# Patient Record
Sex: Female | Born: 1985 | Race: White | Hispanic: No | Marital: Single | Smoking: Never smoker
Health system: Southern US, Community
[De-identification: ages and names within clinical notes are randomized; demographics above are authoritative.]

## PROBLEM LIST (undated history)

## (undated) ENCOUNTER — Inpatient Hospital Stay (HOSPITAL_COMMUNITY): Payer: Self-pay

## (undated) DIAGNOSIS — Z975 Presence of (intrauterine) contraceptive device: Secondary | ICD-10-CM

## (undated) DIAGNOSIS — IMO0002 Reserved for concepts with insufficient information to code with codable children: Secondary | ICD-10-CM

## (undated) DIAGNOSIS — D649 Anemia, unspecified: Secondary | ICD-10-CM

## (undated) HISTORY — PX: GYNECOLOGIC CRYOSURGERY: SHX857

## (undated) HISTORY — PX: KNEE SURGERY: SHX244

---

## 2006-06-26 DIAGNOSIS — R58 Hemorrhage, not elsewhere classified: Secondary | ICD-10-CM

## 2011-03-28 ENCOUNTER — Inpatient Hospital Stay (INDEPENDENT_AMBULATORY_CARE_PROVIDER_SITE_OTHER)
Admission: RE | Admit: 2011-03-28 | Discharge: 2011-03-28 | Disposition: A | Discharge status: Home / Self Care | Payer: Self-pay | Source: Ambulatory Visit | Attending: Family Medicine | Admitting: Family Medicine

## 2011-03-28 DIAGNOSIS — W57XXXA Bitten or stung by nonvenomous insect and other nonvenomous arthropods, initial encounter: Secondary | ICD-10-CM

## 2011-03-28 DIAGNOSIS — R112 Nausea with vomiting, unspecified: Secondary | ICD-10-CM

## 2011-03-28 DIAGNOSIS — N390 Urinary tract infection, site not specified: Secondary | ICD-10-CM

## 2011-03-28 LAB — POCT I-STAT, CHEM 8
BUN: 7 mg/dL (ref 6–23)
Chloride: 105 mEq/L (ref 96–112)
Sodium: 141 mEq/L (ref 135–145)
TCO2: 22 mmol/L (ref 0–100)

## 2011-03-28 LAB — POCT URINALYSIS DIP (DEVICE)
Bilirubin Urine: NEGATIVE
Ketones, ur: NEGATIVE mg/dL
Protein, ur: NEGATIVE mg/dL

## 2011-03-28 LAB — POCT PREGNANCY, URINE: Preg Test, Ur: NEGATIVE

## 2011-03-30 LAB — URINE CULTURE: Colony Count: >=100000

## 2011-07-31 ENCOUNTER — Inpatient Hospital Stay (HOSPITAL_COMMUNITY)
Admission: AD | Admit: 2011-07-31 | Discharge: 2011-07-31 | Disposition: A | Discharge status: Home / Self Care | Payer: Self-pay | Source: Ambulatory Visit | Attending: Obstetrics & Gynecology | Admitting: Obstetrics & Gynecology

## 2011-07-31 ENCOUNTER — Encounter (HOSPITAL_COMMUNITY): Payer: Self-pay

## 2011-07-31 DIAGNOSIS — Z975 Presence of (intrauterine) contraceptive device: Secondary | ICD-10-CM

## 2011-07-31 DIAGNOSIS — N946 Dysmenorrhea, unspecified: Secondary | ICD-10-CM | POA: Insufficient documentation

## 2011-07-31 DIAGNOSIS — Z30432 Encounter for removal of intrauterine contraceptive device: Secondary | ICD-10-CM | POA: Insufficient documentation

## 2011-07-31 DIAGNOSIS — N921 Excessive and frequent menstruation with irregular cycle: Secondary | ICD-10-CM | POA: Insufficient documentation

## 2011-07-31 HISTORY — DX: Presence of (intrauterine) contraceptive device: Z97.5

## 2011-07-31 HISTORY — DX: Reserved for concepts with insufficient information to code with codable children: IMO0002

## 2011-07-31 HISTORY — DX: Anemia, unspecified: D64.9

## 2011-07-31 LAB — WET PREP, GENITAL
Trich, Wet Prep: NONE SEEN
Yeast Wet Prep HPF POC: NONE SEEN

## 2011-07-31 LAB — URINALYSIS, ROUTINE W REFLEX MICROSCOPIC
Bilirubin Urine: NEGATIVE
Glucose, UA: NEGATIVE mg/dL
Hgb urine dipstick: NEGATIVE
Ketones, ur: NEGATIVE mg/dL
Nitrite: NEGATIVE
Protein, ur: NEGATIVE mg/dL
Specific Gravity, Urine: 1.01 (ref 1.005–1.030)
Urobilinogen, UA: 0.2 mg/dL (ref 0.0–1.0)
pH: 6 (ref 5.0–8.0)

## 2011-07-31 LAB — URINE MICROSCOPIC-ADD ON

## 2011-07-31 LAB — POCT PREGNANCY, URINE: Preg Test, Ur: NEGATIVE

## 2011-07-31 NOTE — ED Provider Notes (Signed)
History    Pt presents to MAU with abdominal cramping and vaginal spotting x3 days and expresses worry that her IUD is "in the wrong place" or not working.  IUD expires in 2013 so desires removal at this time and will schedule replacement with provider at later date.  Chief Complaint  Patient presents with  . Abdominal Pain   HPI  OB History    Grav Para Term Preterm Abortions TAB SAB Ect Mult Living   3 2 2  1  1   2       Past Medical History  Diagnosis Date  . Intrauterine device   . Anemia   . Abnormal Pap smear     Past Surgical History  Procedure Date  . Knee surgery     Family History  Problem Relation Age of Onset  . Anesthesia problems Neg Hx     History  Substance Use Topics  . Smoking status: Never Smoker   . Smokeless tobacco: Never Used  . Alcohol Use: 0.6 oz/week    1 Shots of liquor per week    Allergies:  Allergies  Allergen Reactions  . Amoxicillin Anaphylaxis and Hives  . Penicillins Anaphylaxis and Hives    No prescriptions prior to admission    Review of Systems  Constitutional: Negative.   HENT: Negative.   Eyes: Negative.   Respiratory: Negative.   Cardiovascular: Negative.   Gastrointestinal: Negative.   Genitourinary: Negative.        Reports vaginal spotting and menstrual cramping x3 days.  Musculoskeletal: Negative.   Skin: Negative.   Neurological: Negative.   Endo/Heme/Allergies: Negative.   Psychiatric/Behavioral: Negative.    Physical Exam   Blood pressure 118/64, pulse 97, temperature 99 F (37.2 C), temperature source Oral, resp. rate 16, height 5' 8.25" (1.734 m), weight 100.517 kg (221 lb 9.6 oz), SpO2 98.00%.  Physical Exam  Constitutional: She is oriented to person, place, and time. She appears well-developed and well-nourished.  HENT:  Head: Normocephalic.  Neck: Normal range of motion.  Cardiovascular: Normal rate, regular rhythm and normal heart sounds.   Respiratory: Effort normal and breath sounds  normal.  GI: Soft.  Genitourinary: Vagina normal. Cervix exhibits no motion tenderness and no friability. No erythema or bleeding around the vagina. No vaginal discharge found.       Cervix soft, pink, nonfriable, with small amount clear-white discharge.  Mirena strings easily visualized at ~3 cm length from cervical os.  Musculoskeletal: Normal range of motion.  Neurological: She is alert and oriented to person, place, and time.  Skin: Skin is warm and dry.  Psychiatric: She has a normal mood and affect. Her behavior is normal. Judgment and thought content normal.    MAU Course  Procedures Speculum exam, GC Chlamydia, wet prep  With strings easily visualized, IUD appeared to be in place.  Pt still desired removal because of recent onset of cramping/bleeding.  Discussed immediate risks of pregnancy with removal of IUD.  Pt stated understanding and desires removal.. Mirena removed today without complication.  Discussed contraceptive choices with pt.  Assessment and Plan  A: Metrorrhagia with IUD Dysmenoria  P:  D/C home with plan for pt to follow up with OB/Gyn provider for contraception.  LEFTWICH-KIRBY, LISA 07/31/2011, 5:27 PM

## 2011-07-31 NOTE — Progress Notes (Signed)
Pt states IUD since 2008 via Dr. Lilian Kapur in [facility] . Notes pain with intercourse, and has had spotting x3-4 weeks. Pt states feels out of place.

## 2011-07-31 NOTE — Progress Notes (Signed)
Patient states she has had a Mirena since 2008. Has never had any bleeding or pain with it. For the past 3-4 weeks has been having increasing abdominal pain, pain with intercourse and spotting on and off. No bleeding at this time. States she is able to feel the IUD during intercourse and partner feels sticking sensation.

## 2011-08-05 NOTE — ED Provider Notes (Signed)
Agree with above note.  Oneika Simonian H. 08/05/2011 8:00 PM

## 2014-05-22 ENCOUNTER — Encounter (HOSPITAL_COMMUNITY): Payer: Self-pay

## 2015-02-05 ENCOUNTER — Inpatient Hospital Stay (HOSPITAL_COMMUNITY)
Admission: AD | Admit: 2015-02-05 | Discharge: 2015-02-06 | Disposition: A | Discharge status: Home / Self Care | Payer: Self-pay | Source: Ambulatory Visit | Attending: Obstetrics & Gynecology | Admitting: Obstetrics & Gynecology

## 2015-02-05 ENCOUNTER — Encounter (HOSPITAL_COMMUNITY): Payer: Self-pay | Admitting: Medical

## 2015-02-05 DIAGNOSIS — O219 Vomiting of pregnancy, unspecified: Secondary | ICD-10-CM

## 2015-02-05 DIAGNOSIS — O418X10 Other specified disorders of amniotic fluid and membranes, first trimester, not applicable or unspecified: Secondary | ICD-10-CM

## 2015-02-05 DIAGNOSIS — O468X1 Other antepartum hemorrhage, first trimester: Secondary | ICD-10-CM

## 2015-02-05 DIAGNOSIS — O209 Hemorrhage in early pregnancy, unspecified: Secondary | ICD-10-CM

## 2015-02-05 DIAGNOSIS — O208 Other hemorrhage in early pregnancy: Secondary | ICD-10-CM | POA: Insufficient documentation

## 2015-02-05 DIAGNOSIS — O210 Mild hyperemesis gravidarum: Secondary | ICD-10-CM | POA: Insufficient documentation

## 2015-02-05 DIAGNOSIS — Z3A01 Less than 8 weeks gestation of pregnancy: Secondary | ICD-10-CM | POA: Insufficient documentation

## 2015-02-05 NOTE — MAU Provider Note (Signed)
History     CSN: [id number]  Arrival date and time: 02/05/15 2231   First Provider Initiated Contact with Patient 02/05/15 2351      No chief complaint on file.  HPI Ms. Anna Luna is a 29 y.o. [phone] at 102w1d who presents to MAU today with complaint of spotting, cramping and N/V. The patient states recent +HPT. She states spotting and cramping has been intermittent for the last few days. She rates pain at 4/10 now. She denies UTI symptoms, diarrhea or fever. She states very light spotting only without heavy bleeding or clots. She states N/V for the last few days. She is unable to keep anything down. She has not taken anything for pain or nausea.   OB History    Gravida Para Term Preterm AB TAB SAB Ectopic Multiple Living   5 2 2  1  1   2       Past Medical History  Diagnosis Date  . Intrauterine device   . Anemia   . Abnormal Pap smear     Past Surgical History  Procedure Laterality Date  . Knee surgery      Family History  Problem Relation Age of Onset  . Anesthesia problems Neg Hx     History  Substance Use Topics  . Smoking status: Never Smoker   . Smokeless tobacco: Never Used  . Alcohol Use: No    Allergies:  Allergies  Allergen Reactions  . Amoxicillin Anaphylaxis and Hives  . Penicillins Anaphylaxis and Hives    No prescriptions prior to admission    Review of Systems  Constitutional: Negative for fever and malaise/fatigue.  Gastrointestinal: Positive for nausea, vomiting and abdominal pain. Negative for diarrhea and constipation.  Genitourinary: Negative for dysuria, urgency and frequency.       + vaginal bleeding Neg - vaginal discharge   Physical Exam   Blood pressure 123/78, pulse 96, temperature 98.3 F (36.8 C), temperature source Oral, resp. rate 16, height 5\' 8"  (1.727 m), weight 208 lb (94.348 kg), last menstrual period 12/31/2014, SpO2 100 %.  Physical Exam  Nursing note and vitals reviewed. Constitutional: She is oriented  to person, place, and time. She appears well-developed and well-nourished. No distress.  HENT:  Head: Normocephalic and atraumatic.  Cardiovascular: Normal rate.   Respiratory: Effort normal.  GI: Soft. She exhibits no distension and no mass. There is no tenderness. There is no rebound and no guarding.  Neurological: She is alert and oriented to person, place, and time.  Skin: Skin is warm and dry. No erythema.  Psychiatric: She has a normal mood and affect.   Results for orders placed or performed during the hospital encounter of 02/05/15 (from the past 24 hour(s))  Urinalysis, Routine w reflex microscopic (not at [facility])     Status: Abnormal   Collection Time: 02/05/15 11:22 PM  Result Value Ref Range   Color, Urine YELLOW YELLOW   APPearance CLEAR CLEAR   Specific Gravity, Urine >1.030 (H) 1.005 - 1.030   pH 5.5 5.0 - 8.0   Glucose, UA NEGATIVE NEGATIVE mg/dL   Hgb urine dipstick NEGATIVE NEGATIVE   Bilirubin Urine SMALL (A) NEGATIVE   Ketones, ur 15 (A) NEGATIVE mg/dL   Protein, ur NEGATIVE NEGATIVE mg/dL   Urobilinogen, UA 0.2 0.0 - 1.0 mg/dL   Nitrite NEGATIVE NEGATIVE   Leukocytes, UA TRACE (A) NEGATIVE  Urine microscopic-add on     Status: Abnormal   Collection Time: 02/05/15 11:22 PM  Result Value Ref  Range   Squamous Epithelial / LPF MANY (A) RARE   WBC, UA 3-6 <3 WBC/hpf   RBC / HPF 0-2 <3 RBC/hpf   Bacteria, UA FEW (A) RARE   Urine-Other MUCOUS PRESENT   Pregnancy, urine POC     Status: Abnormal   Collection Time: 02/05/15 11:49 PM  Result Value Ref Range   Preg Test, Ur POSITIVE (A) NEGATIVE  CBC with Differential/Platelet     Status: None   Collection Time: 02/06/15 12:12 AM  Result Value Ref Range   WBC 6.8 4.0 - 10.5 K/uL   RBC 4.07 3.87 - 5.11 MIL/uL   Hemoglobin 12.7 12.0 - 15.0 g/dL   HCT 32.438.2 40.136.0 - 02.746.0 %   MCV 93.9 78.0 - 100.0 fL   MCH 31.2 26.0 - 34.0 pg   MCHC 33.2 30.0 - 36.0 g/dL   RDW 25.313.0 66.411.5 - 40.315.5 %   Platelets 283 150 - 400 K/uL    Neutrophils Relative % 57 43 - 77 %   Neutro Abs 3.8 1.7 - 7.7 K/uL   Lymphocytes Relative 39 12 - 46 %   Lymphs Abs 2.6 0.7 - 4.0 K/uL   Monocytes Relative 4 3 - 12 %   Monocytes Absolute 0.3 0.1 - 1.0 K/uL   Eosinophils Relative 0 0 - 5 %   Eosinophils Absolute 0.0 0.0 - 0.7 K/uL   Basophils Relative 0 0 - 1 %   Basophils Absolute 0.0 0.0 - 0.1 K/uL  ABO/Rh     Status: None (Preliminary result)   Collection Time: 02/06/15 12:12 AM  Result Value Ref Range   ABO/RH(D) O POS   hCG, quantitative, pregnancy     Status: Abnormal   Collection Time: 02/06/15 12:12 AM  Result Value Ref Range   hCG, Beta Chain, Quant, S 11409 (H) <5 mIU/mL   [place] Ob Comp Less 14 Wks  02/06/2015   CLINICAL DATA:  29 year old pregnant female with vaginal bleeding  EXAM: OBSTETRIC <14 WK US AND TRANSVAGINAL OB US  TECHNIQUE: Both transabdominal and transvaginal ultrasound examinations were performed for complete evaluation of the gestation as well as the maternal uterus, adnexal regions, and pelvic cul-de-sac. Transvaginal technique was performed to assess early pregnancy.  COMPARISON:  None.  FINDINGS: Intrauterine gestational sac: Visualized/normal in shape.  Yolk sac:  Present  Embryo:  Not seen  Cardiac Activity: NA  Heart Rate: NA  bpm  MSD: 10  Mm   5 w   5  d  Maternal uterus/adnexae: There is a 1.8 x 0.8 x 1.4 cm right subchorionic hemorrhage. Maternal ovaries appear unremarkable. Right ovarian probable corpus luteum.  IMPRESSION: Single intrauterine gestational sac with an estimated gestational age of [redacted] weeks, 5 days. No fetal pole identified at this time.  A small subchorionic hemorrhage noted.  Follow-up recommended.   Electronically Signed   By: Elgie CollardArash  Radparvar M.D.   On: 02/06/2015 01:39   [place] Ob Transvaginal  02/06/2015   CLINICAL DATA:  29 year old pregnant female with vaginal bleeding  EXAM: OBSTETRIC <14 WK US AND TRANSVAGINAL OB US  TECHNIQUE: Both transabdominal and transvaginal ultrasound examinations  were performed for complete evaluation of the gestation as well as the maternal uterus, adnexal regions, and pelvic cul-de-sac. Transvaginal technique was performed to assess early pregnancy.  COMPARISON:  None.  FINDINGS: Intrauterine gestational sac: Visualized/normal in shape.  Yolk sac:  Present  Embryo:  Not seen  Cardiac Activity: NA  Heart Rate: NA  bpm  MSD: 10  Mm  5 w   5  d  Maternal uterus/adnexae: There is a 1.8 x 0.8 x 1.4 cm right subchorionic hemorrhage. Maternal ovaries appear unremarkable. Right ovarian probable corpus luteum.  IMPRESSION: Single intrauterine gestational sac with an estimated gestational age of [redacted] weeks, 5 days. No fetal pole identified at this time.  A small subchorionic hemorrhage noted.  Follow-up recommended.   Electronically Signed   By: Elgie Collard M.D.   On: 02/06/2015 01:39    MAU Course  Procedures  None  MDM +UPT UA, wet prep, GC/chlamydia, CBC, ABO/Rh, quant hCG, HIV, RPR and [place] today to rule out ectopic pregnancy Multiple attempts to start IV performed by RNs and CRNAs without success.  Discussed options with patient to attempt PO vs suppository. Patient would prefer to trial PO at home.  Assessment and Plan  A: IUGS and YS at [redacted]w[redacted]d Small subchorionic hemorrhage Nausea and vomiting in pregnancy  P: Discharge home Rx for Phenergan given to patient First trimester/bleeding precautions discussed Patient advised to follow-up with OB provider of choice to start prenatal care Patient may return to MAU as needed or if her condition were to change or worsen   Marny Lowenstein, PA-C  02/06/2015, 2:58 AM

## 2015-02-05 NOTE — MAU Note (Signed)
Pt reports she had a positive home preg test last week and today she has been spotting and cramping. States the spotting is light pink. Also reports she has had a lot of nausea and vomiting.

## 2015-02-06 ENCOUNTER — Inpatient Hospital Stay (HOSPITAL_COMMUNITY): Payer: Self-pay

## 2015-02-06 ENCOUNTER — Encounter (HOSPITAL_COMMUNITY): Payer: Self-pay | Admitting: *Deleted

## 2015-02-06 DIAGNOSIS — O468X1 Other antepartum hemorrhage, first trimester: Secondary | ICD-10-CM

## 2015-02-06 DIAGNOSIS — Z3A01 Less than 8 weeks gestation of pregnancy: Secondary | ICD-10-CM

## 2015-02-06 LAB — CBC WITH DIFFERENTIAL/PLATELET
BASOS ABS: 0 10*3/uL (ref 0.0–0.1)
Basophils Relative: 0 % (ref 0–1)
EOS PCT: 0 % (ref 0–5)
Eosinophils Absolute: 0 10*3/uL (ref 0.0–0.7)
HEMATOCRIT: 38.2 % (ref 36.0–46.0)
HEMOGLOBIN: 12.7 g/dL (ref 12.0–15.0)
LYMPHS PCT: 39 % (ref 12–46)
Lymphs Abs: 2.6 10*3/uL (ref 0.7–4.0)
MCH: 31.2 pg (ref 26.0–34.0)
MCHC: 33.2 g/dL (ref 30.0–36.0)
MCV: 93.9 fL (ref 78.0–100.0)
Monocytes Absolute: 0.3 10*3/uL (ref 0.1–1.0)
Monocytes Relative: 4 % (ref 3–12)
NEUTROS ABS: 3.8 10*3/uL (ref 1.7–7.7)
Neutrophils Relative %: 57 % (ref 43–77)
Platelets: 283 10*3/uL (ref 150–400)
RBC: 4.07 MIL/uL (ref 3.87–5.11)
RDW: 13 % (ref 11.5–15.5)
WBC: 6.8 10*3/uL (ref 4.0–10.5)

## 2015-02-06 LAB — URINALYSIS, ROUTINE W REFLEX MICROSCOPIC
Glucose, UA: NEGATIVE mg/dL
HGB URINE DIPSTICK: NEGATIVE
KETONES UR: 15 mg/dL — AB
NITRITE: NEGATIVE
PH: 5.5 (ref 5.0–8.0)
PROTEIN: NEGATIVE mg/dL
Specific Gravity, Urine: >1.03 — ABNORMAL HIGH (ref 1.005–1.030)
Urobilinogen, UA: 0.2 mg/dL (ref 0.0–1.0)

## 2015-02-06 LAB — URINE MICROSCOPIC-ADD ON

## 2015-02-06 LAB — POCT PREGNANCY, URINE: PREG TEST UR: POSITIVE — AB

## 2015-02-06 LAB — RPR: RPR: NONREACTIVE

## 2015-02-06 LAB — ABO/RH: ABO/RH(D): O POS

## 2015-02-06 LAB — HCG, QUANTITATIVE, PREGNANCY: HCG, BETA CHAIN, QUANT, S: 11409 m[IU]/mL — AB (ref <5)

## 2015-02-06 LAB — HIV ANTIBODY (ROUTINE TESTING W REFLEX): HIV SCREEN 4TH GENERATION: NONREACTIVE

## 2015-02-06 MED ORDER — PROMETHAZINE HCL 12.5 MG PO TABS: 12.5000 mg | ORAL_TABLET | Freq: Four times a day (QID) | ORAL | Status: AC | PRN

## 2015-02-06 MED ORDER — SODIUM CHLORIDE 0.9 % IV SOLN
25.0000 mg | Freq: Once | INTRAVENOUS | Status: DC
  Filled 2015-02-06: qty 1

## 2015-02-06 NOTE — Discharge Instructions (Signed)
Morning Sickness °Morning sickness is when you feel sick to your stomach (nauseous) during pregnancy. This nauseous feeling may or may not come with vomiting. It often occurs in the morning but can be a problem any time of day. Morning sickness is most common during the first trimester, but it may continue throughout pregnancy. While morning sickness is unpleasant, it is usually harmless unless you develop severe and continual vomiting (hyperemesis gravidarum). This condition requires more intense treatment.  °CAUSES  °The cause of morning sickness is not completely known but seems to be related to normal hormonal changes that occur in pregnancy. °RISK FACTORS °You are at greater risk if you: °· Experienced nausea or vomiting before your pregnancy. °· Had morning sickness during a previous pregnancy. °· Are pregnant with more than one baby, such as twins. °TREATMENT  °Do not use any medicines (prescription, over-the-counter, or herbal) for morning sickness without first talking to your health care provider. Your health care provider may prescribe or recommend: °· Vitamin B6 supplements. °· Anti-nausea medicines. °· The herbal medicine ginger. °HOME CARE INSTRUCTIONS  °· Only take over-the-counter or prescription medicines as directed by your health care provider. °· Taking multivitamins before getting pregnant can prevent or decrease the severity of morning sickness in most women. °· Eat a piece of dry toast or unsalted crackers before getting out of bed in the morning. °· Eat five or six small meals a day. °· Eat dry and bland foods (rice, baked potato). Foods high in carbohydrates are often helpful. °· Do not drink liquids with your meals. Drink liquids between meals. °· Avoid greasy, fatty, and spicy foods. °· Get someone to cook for you if the smell of any food causes nausea and vomiting. °· If you feel nauseous after taking prenatal vitamins, take the vitamins at night or with a snack.  °· Snack on protein  foods (nuts, yogurt, cheese) between meals if you are hungry. °· Eat unsweetened gelatins for desserts. °· Wearing an acupressure wristband (worn for sea sickness) may be helpful. °· Acupuncture may be helpful. °· Do not smoke. °· Get a humidifier to keep the air in your house free of odors. °· Get plenty of fresh air. °SEEK MEDICAL CARE IF:  °· Your home remedies are not working, and you need medicine. °· You feel dizzy or lightheaded. °· You are losing weight. °SEEK IMMEDIATE MEDICAL CARE IF:  °· You have persistent and uncontrolled nausea and vomiting. °· You pass out (faint). °MAKE SURE YOU: °· Understand these instructions. °· Will watch your condition. °· Will get help right away if you are not doing well or get worse. °Document Released: 08/28/2006 Document Revised: 07/12/2013 Document Reviewed: 12/22/2012 °ExitCare® Patient Information ©2015 [employer]. This information is not intended to replace advice given to you by your health care provider. Make sure you discuss any questions you have with your health care provider. ° ° °Eating Plan for Hyperemesis Gravidarum °Severe cases of hyperemesis gravidarum can lead to dehydration and malnutrition. The hyperemesis eating plan is one way to lessen the symptoms of nausea and vomiting. It is often used with prescribed medicines to control your symptoms.  °WHAT CAN I DO TO RELIEVE MY SYMPTOMS? °Listen to your body. Everyone is different and has different preferences. Find what works best for you. Some of the following things may help: °· Eat and drink slowly. °· Eat 5-6 small meals daily instead of 3 large meals.   °· Eat crackers before you get out of bed in the   morning.   °· Starchy foods are usually well tolerated (such as cereal, toast, bread, potatoes, pasta, rice, and pretzels).   °· Ginger may help with nausea. Add ¼ tsp ground ginger to hot tea or choose ginger tea.   °· Try drinking 100% fruit juice or an electrolyte drink. °· Continue to take your  prenatal vitamins as directed by your health care provider. If you are having trouble taking your prenatal vitamins, talk with your health care provider about different options. °· Include at least 1 serving of protein with your meals and snacks (such as meats or poultry, beans, nuts, eggs, or yogurt). Try eating a protein-rich snack before bed (such as cheese and crackers or a half turkey or peanut butter sandwich). °WHAT THINGS SHOULD I AVOID TO REDUCE MY SYMPTOMS? °The following things may help reduce your symptoms: °· Avoid foods with strong smells. Try eating meals in well-ventilated areas that are free of odors. °· Avoid drinking water or other beverages with meals. Try not to drink anything less than 30 minutes before and after meals. °· Avoid drinking more than 1 cup of fluid at a time. °· Avoid fried or high-fat foods, such as butter and cream sauces. °· Avoid spicy foods. °· Avoid skipping meals the best you can. Nausea can be more intense on an empty stomach. If you cannot tolerate food at that time, do not force it. Try sucking on ice chips or other frozen items and make up the calories later. °· Avoid lying down within 2 hours after eating. °Document Released: 05/04/2007 Document Revised: 07/12/2013 Document Reviewed: 05/11/2013 °ExitCare® Patient Information ©2015 [employer]. This information is not intended to replace advice given to you by your health care provider. Make sure you discuss any questions you have with your health care provider. ° ° °

## 2015-05-15 ENCOUNTER — Inpatient Hospital Stay (HOSPITAL_COMMUNITY)
Admission: AD | Admit: 2015-05-15 | Discharge: 2015-05-15 | Disposition: A | Discharge status: Home / Self Care | Payer: Self-pay | Source: Ambulatory Visit | Attending: Obstetrics & Gynecology | Admitting: Obstetrics & Gynecology

## 2015-05-15 ENCOUNTER — Encounter (HOSPITAL_COMMUNITY): Payer: Self-pay | Admitting: *Deleted

## 2015-05-15 DIAGNOSIS — Z3A19 19 weeks gestation of pregnancy: Secondary | ICD-10-CM | POA: Insufficient documentation

## 2015-05-15 DIAGNOSIS — O26892 Other specified pregnancy related conditions, second trimester: Secondary | ICD-10-CM | POA: Insufficient documentation

## 2015-05-15 DIAGNOSIS — N898 Other specified noninflammatory disorders of vagina: Secondary | ICD-10-CM | POA: Insufficient documentation

## 2015-05-15 DIAGNOSIS — R102 Pelvic and perineal pain: Secondary | ICD-10-CM | POA: Insufficient documentation

## 2015-05-15 LAB — WET PREP, GENITAL
Clue Cells Wet Prep HPF POC: NONE SEEN
TRICH WET PREP: NONE SEEN
YEAST WET PREP: NONE SEEN

## 2015-05-15 LAB — URINALYSIS, ROUTINE W REFLEX MICROSCOPIC
BILIRUBIN URINE: NEGATIVE
Glucose, UA: NEGATIVE mg/dL
HGB URINE DIPSTICK: NEGATIVE
KETONES UR: NEGATIVE mg/dL
Leukocytes, UA: NEGATIVE
NITRITE: NEGATIVE
PROTEIN: NEGATIVE mg/dL
Specific Gravity, Urine: 1.025 (ref 1.005–1.030)
UROBILINOGEN UA: 0.2 mg/dL (ref 0.0–1.0)
pH: 6 (ref 5.0–8.0)

## 2015-05-15 NOTE — Discharge Instructions (Signed)

## 2015-05-15 NOTE — MAU Note (Signed)
Noted mucous d/c yesterday, ? Mucous plug- last night and today noted blood mixed with it. Cramping and tightening in abd started yesterday. Denies hx of PTL

## 2015-05-15 NOTE — MAU Provider Note (Signed)
Chief Complaint: Vaginal Discharge and Abdominal Cramping   First Provider Initiated Contact with Patient 05/15/15 1032      SUBJECTIVE HPI: Anna Luna is a 29 y.o. [id number] at 2612w2d by LMP who presents to maternity admissions reporting onset of abdominal cramping yesterday and mucus discharge with more discharge that is blood tinged today.  She reports her full term labors have started with similar symptoms so she came to be evaluated.   She denies vaginal bleeding, vaginal itching/burning, urinary symptoms, h/a, dizziness, n/v, or fever/chills.     Vaginal Discharge The patient's primary symptoms include pelvic pain, vaginal bleeding and vaginal discharge. This is a new problem. The current episode started yesterday. The problem occurs intermittently. The problem has been waxing and waning. The pain is mild. She is pregnant. Associated symptoms include abdominal pain. Pertinent negatives include no back pain, chills, constipation, diarrhea, dysuria, fever, flank pain, frequency, headaches, nausea, urgency or vomiting. The vaginal discharge was mucoid, thick and copious. The vaginal bleeding is spotting. She has not been passing clots. She has not been passing tissue. Nothing aggravates the symptoms. She has tried nothing for the symptoms.  Abdominal Cramping This is a new problem. The current episode started yesterday. The onset quality is gradual. The problem occurs intermittently. The problem has been waxing and waning. The pain is located in the generalized abdominal region. The pain is mild. The quality of the pain is cramping. The abdominal pain does not radiate. Pertinent negatives include no constipation, diarrhea, dysuria, fever, frequency, headaches, nausea or vomiting. Nothing aggravates the pain. The pain is relieved by nothing. She has tried nothing for the symptoms.    Past Medical History  Diagnosis Date  . Intrauterine device   . Anemia   . Abnormal Pap smear    Past  Surgical History  Procedure Laterality Date  . Knee surgery    . Gynecologic cryosurgery      abnormal PAP    Social History   Social History  . Marital Status: Single    Spouse Name: N/A  . Number of Children: N/A  . Years of Education: N/A   Occupational History  . Not on file.   Social History Main Topics  . Smoking status: Never Smoker   . Smokeless tobacco: Never Used  . Alcohol Use: No  . Drug Use: No  . Sexual Activity: Yes    Birth Control/ Protection: IUD, None   Other Topics Concern  . Not on file   Social History Narrative   No current facility-administered medications on file prior to encounter.   Current Outpatient Prescriptions on File Prior to Encounter  Medication Sig Dispense Refill  . promethazine (PHENERGAN) 12.5 MG tablet Take 1 tablet (12.5 mg total) by mouth every 6 (six) hours as needed for nausea or vomiting. 30 tablet 0   Allergies  Allergen Reactions  . Amoxicillin Anaphylaxis and Hives  . Penicillins Anaphylaxis and Hives    ROS:  Review of Systems  Constitutional: Negative for fever, chills and fatigue.  HENT: Negative for sinus pressure.   Eyes: Negative for photophobia.  Respiratory: Negative for shortness of breath.   Cardiovascular: Negative for chest pain.  Gastrointestinal: Positive for abdominal pain. Negative for nausea, vomiting, diarrhea and constipation.  Genitourinary: Positive for vaginal discharge and pelvic pain. Negative for dysuria, urgency, frequency, flank pain, vaginal bleeding, difficulty urinating and vaginal pain.  Musculoskeletal: Negative for back pain and neck pain.  Neurological: Negative for dizziness, weakness and headaches.  Psychiatric/Behavioral: Negative.  I have reviewed patient's Past Medical Hx, Surgical Hx, Family Hx, Social Hx, medications and allergies.   Physical Exam   Patient Vitals for the past 24 hrs:  BP Temp Temp src Pulse Resp Weight  05/15/15 0935 107/65 mmHg 97.7 F (36.5  C) Oral 92 18 95.981 kg (211 lb 9.6 oz)   Constitutional: Well-developed, well-nourished female in no acute distress.  Cardiovascular: normal rate Respiratory: normal effort GI: Abd soft, non-tender. Pos BS x 4 MS: Extremities nontender, no edema, normal ROM Neurologic: Alert and oriented x 4.  GU: Neg CVAT.  PELVIC EXAM: Cervix pink, visually closed, without lesion, small amount white creamy discharge, no bleeding noted, vaginal walls and external genitalia normal  Cervix 0/long/high, firm, posterior, no blood on glove following exam  FHT 152 by doppler  LAB RESULTS Results for orders placed or performed during the hospital encounter of 05/15/15 (from the past 24 hour(s))  Urinalysis, Routine w reflex microscopic (not at [facility])     Status: None   Collection Time: 05/15/15  9:38 AM  Result Value Ref Range   Color, Urine YELLOW YELLOW   APPearance CLEAR CLEAR   Specific Gravity, Urine 1.025 1.005 - 1.030   pH 6.0 5.0 - 8.0   Glucose, UA NEGATIVE NEGATIVE mg/dL   Hgb urine dipstick NEGATIVE NEGATIVE   Bilirubin Urine NEGATIVE NEGATIVE   Ketones, ur NEGATIVE NEGATIVE mg/dL   Protein, ur NEGATIVE NEGATIVE mg/dL   Urobilinogen, UA 0.2 0.0 - 1.0 mg/dL   Nitrite NEGATIVE NEGATIVE   Leukocytes, UA NEGATIVE NEGATIVE  Wet prep, genital     Status: Abnormal   Collection Time: 05/15/15 10:26 AM  Result Value Ref Range   Yeast Wet Prep HPF POC NONE SEEN NONE SEEN   Trich, Wet Prep NONE SEEN NONE SEEN   Clue Cells Wet Prep HPF POC NONE SEEN NONE SEEN   WBC, Wet Prep HPF POC FEW (A) NONE SEEN    --/--/O POS (07/19 0012)  IMAGING No results found.  MAU Management/MDM: Ordered labs and reviewed results. Cervical exam with no evidence of preterm labor.  FHT normal.  Reassurance provided to pt, preterm labor precautions given.  Outpatient anatomy US ordered.  Pt stable at time of discharge.  ASSESSMENT 1. Vaginal discharge during pregnancy in second trimester   2. Pelvic pain  affecting pregnancy in second trimester, antepartum   3. [redacted] weeks gestation of pregnancy     PLAN Discharge home with PTL precautions Outpatient anatomy scan ordered F/U at health dept as scheduled Return to MAU as needed for emergencies    Medication List    TAKE these medications        promethazine 12.5 MG tablet  Commonly known as:  PHENERGAN  Take 1 tablet (12.5 mg total) by mouth every 6 (six) hours as needed for nausea or vomiting.       Follow-up Information    Please follow up.   Why:  The health department as scheduled for prenatal visits      Follow up with [facility].   Specialty:  Radiology   Why:  Ultrasound will call you with appointment.   Contact information:   [address] [id number] mc [place] [address] [phone]      Sharen Counter Certified Nurse-Midwife 05/15/2015  11:03 AM

## 2015-05-16 LAB — GC/CHLAMYDIA PROBE AMP (~~LOC~~) NOT AT ARMC
CHLAMYDIA, DNA PROBE: NEGATIVE
Neisseria Gonorrhea: NEGATIVE

## 2015-06-11 ENCOUNTER — Encounter: Payer: Self-pay | Admitting: Obstetrics and Gynecology

## 2015-06-19 ENCOUNTER — Ambulatory Visit (INDEPENDENT_AMBULATORY_CARE_PROVIDER_SITE_OTHER): Payer: Self-pay | Admitting: Certified Nurse Midwife

## 2015-06-19 ENCOUNTER — Encounter: Payer: Self-pay | Admitting: *Deleted

## 2015-06-19 ENCOUNTER — Encounter: Payer: Self-pay | Admitting: Certified Nurse Midwife

## 2015-06-19 VITALS — BP 105/67 | HR 92 | Temp 98.3°F | Wt 218.3 lb

## 2015-06-19 DIAGNOSIS — O0932 Supervision of pregnancy with insufficient antenatal care, second trimester: Secondary | ICD-10-CM

## 2015-06-19 DIAGNOSIS — O0930 Supervision of pregnancy with insufficient antenatal care, unspecified trimester: Secondary | ICD-10-CM | POA: Insufficient documentation

## 2015-06-19 LAB — POCT URINALYSIS DIP (DEVICE)
Bilirubin Urine: NEGATIVE
Bilirubin Urine: NEGATIVE
Glucose, UA: NEGATIVE mg/dL
Glucose, UA: NEGATIVE mg/dL
Hgb urine dipstick: NEGATIVE
Hgb urine dipstick: NEGATIVE
Ketones, ur: NEGATIVE mg/dL
Ketones, ur: NEGATIVE mg/dL
Nitrite: NEGATIVE
Nitrite: NEGATIVE
Protein, ur: 30 mg/dL — AB
Protein, ur: NEGATIVE mg/dL
Specific Gravity, Urine: 1.025 (ref 1.005–1.030)
Specific Gravity, Urine: 1.025 (ref 1.005–1.030)
Urobilinogen, UA: 0.2 mg/dL (ref 0.0–1.0)
Urobilinogen, UA: 0.2 mg/dL (ref 0.0–1.0)
pH: 6.5 (ref 5.0–8.0)
pH: 6.5 (ref 5.0–8.0)

## 2015-06-19 NOTE — Progress Notes (Signed)
Pain- cramping New ob packet given Early glucola given due to BMI >30 Anatomy US scheduled for 06/22/15 @ 1000

## 2015-06-19 NOTE — Progress Notes (Signed)
   Subjective:    Anna Luna is a [id number] 4138w2d being seen today for her first obstetrical visit.  Her obstetrical history is significant for late entry to care in second trimester. She reports having 2 previous 9lb + babies. Patient does intend to breast feed. Pregnancy history fully reviewed.  Patient reports no complaints.  Filed Vitals:   06/19/15 1400  BP: 105/67  Pulse: 92  Temp: 98.3 F (36.8 C)  Weight: 218 lb 4.8 oz (99.02 kg)    HISTORY: OB History  Gravida Para Term Preterm AB SAB TAB Ectopic Multiple Living  5 3 3  0 1 1    3     # Outcome Date GA Lbr Len/2nd Weight Sex Delivery Anes PTL Lv  5 Current           4 Term 01/05/13 6453w0d  6 lb 3 oz (2.807 kg) F Vag-Spont EPI N Y  3 Term 06/26/06 703w0d  9 lb 7 oz (4.281 kg) F Vag-Spont EPI N Y     Complications: Hemorrhage  2 Term 03/11/05 543w0d  9 lb 3 oz (4.167 kg) M Vag-Spont EPI  Y  1 SAB 2005             Past Medical History  Diagnosis Date  . Intrauterine device   . Anemia   . Abnormal Pap smear    Past Surgical History  Procedure Laterality Date  . Knee surgery    . Gynecologic cryosurgery      abnormal PAP    Family History  Problem Relation Age of Onset  . Anesthesia problems Neg Hx      Exam    Uterus:     Pelvic Exam:    Perineum:    Vulva:    Vagina:     pH:    Cervix:    Adnexa:    Bony Pelvis:   System: Breast:     Skin: normal coloration and turgor, no rashes    Neurologic: oriented, normal   Extremities: normal strength, tone, and muscle mass   HEENT    Mouth/Teeth mucous membranes moist, pharynx normal without lesions   Neck supple and no masses   Cardiovascular: regular rate and rhythm   Respiratory:  appears well, vitals normal, no respiratory distress, acyanotic, normal RR, ear and throat exam is normal, neck free of mass or lymphadenopathy, chest clear, no wheezing, crepitations, rhonchi, normal symmetric air entry   Abdomen: soft, non-tender; bowel sounds normal; no  masses,  no organomegaly   Urinary:       Assessment:    Pregnancy: [id number] Patient Active Problem List   Diagnosis Date Noted  . Late prenatal care affecting pregnancy 06/19/2015        Plan:     Initial labs drawn. Prenatal vitamins. Problem list reviewed and updated. Genetic Screening discussed First Screen: too late.  Ultrasound discussed; fetal survey: ordered. 06/22/2015  Follow up in 4 weeks. 50% of  30 min visit spent on counseling and coordination of care.    Clemmons,Lori Grissett 06/19/2015

## 2015-06-19 NOTE — Addendum Note (Signed)
Addended by: Rockwell GermanyFIELDS, Terrian Ridlon A on: 06/19/2015 03:20 PM   Modules accepted: Orders

## 2015-06-19 NOTE — Patient Instructions (Signed)

## 2015-06-20 LAB — PRENATAL PROFILE (SOLSTAS)
Antibody Screen: NEGATIVE
Basophils Absolute: 0 10*3/uL (ref 0.0–0.1)
Basophils Relative: 0 % (ref 0–1)
Eosinophils Absolute: 0.1 10*3/uL (ref 0.0–0.7)
Eosinophils Relative: 3 % (ref 0–5)
HCT: 31.7 % — ABNORMAL LOW (ref 36.0–46.0)
HIV 1&2 Ab, 4th Generation: NONREACTIVE
Hemoglobin: 10.5 g/dL — ABNORMAL LOW (ref 12.0–15.0)
Hepatitis B Surface Ag: NEGATIVE
Lymphocytes Relative: 29 % (ref 12–46)
Lymphs Abs: 1.4 10*3/uL (ref 0.7–4.0)
MCH: 30.3 pg (ref 26.0–34.0)
MCHC: 33.1 g/dL (ref 30.0–36.0)
MCV: 91.6 fL (ref 78.0–100.0)
MPV: 9.6 fL (ref 8.6–12.4)
Monocytes Absolute: 0.3 10*3/uL (ref 0.1–1.0)
Monocytes Relative: 7 % (ref 3–12)
Neutro Abs: 2.9 10*3/uL (ref 1.7–7.7)
Neutrophils Relative %: 61 % (ref 43–77)
Platelets: 314 10*3/uL (ref 150–400)
RBC: 3.46 MIL/uL — ABNORMAL LOW (ref 3.87–5.11)
RDW: 13.3 % (ref 11.5–15.5)
Rh Type: POSITIVE
Rubella: 2.74 Index — ABNORMAL HIGH (ref <0.90)
WBC: 4.8 10*3/uL (ref 4.0–10.5)

## 2015-06-20 LAB — GLUCOSE TOLERANCE, 1 HOUR (50G) W/O FASTING: Glucose, 1 Hour GTT: 73 mg/dL (ref 70–140)

## 2015-06-21 LAB — PRESCRIPTION MONITORING PROFILE (19 PANEL)
Amphetamine/Meth: NEGATIVE ng/mL
Barbiturate Screen, Urine: NEGATIVE ng/mL
Benzodiazepine Screen, Urine: NEGATIVE ng/mL
Buprenorphine, Urine: NEGATIVE ng/mL
Cannabinoid Scrn, Ur: NEGATIVE ng/mL
Carisoprodol, Urine: NEGATIVE ng/mL
Cocaine Metabolites: NEGATIVE ng/mL
Creatinine, Urine: 175.07 mg/dL (ref >20.0)
Fentanyl, Ur: NEGATIVE ng/mL
MDMA URINE: NEGATIVE ng/mL
Meperidine, Ur: NEGATIVE ng/mL
Methadone Screen, Urine: NEGATIVE ng/mL
Methaqualone: NEGATIVE ng/mL
Nitrites, Initial: NEGATIVE ug/mL
Opiate Screen, Urine: NEGATIVE ng/mL
Oxycodone Screen, Ur: NEGATIVE ng/mL
Phencyclidine, Ur: NEGATIVE ng/mL
Propoxyphene: NEGATIVE ng/mL
Tapentadol, urine: NEGATIVE ng/mL
Tramadol Scrn, Ur: NEGATIVE ng/mL
Zolpidem, Urine: NEGATIVE ng/mL
pH, Initial: 7 pH (ref 4.5–8.9)

## 2015-06-21 LAB — CULTURE, OB URINE
Colony Count: NO GROWTH
Organism ID, Bacteria: NO GROWTH

## 2015-06-22 ENCOUNTER — Ambulatory Visit (HOSPITAL_COMMUNITY)
Admission: RE | Admit: 2015-06-22 | Discharge: 2015-06-22 | Disposition: A | Discharge status: Home / Self Care | Payer: Self-pay | Source: Ambulatory Visit | Attending: Certified Nurse Midwife | Admitting: Certified Nurse Midwife

## 2015-06-22 DIAGNOSIS — Z36 Encounter for antenatal screening of mother: Secondary | ICD-10-CM | POA: Insufficient documentation

## 2015-06-22 DIAGNOSIS — Z3A25 25 weeks gestation of pregnancy: Secondary | ICD-10-CM | POA: Insufficient documentation

## 2015-06-22 DIAGNOSIS — O3442 Maternal care for other abnormalities of cervix, second trimester: Secondary | ICD-10-CM | POA: Insufficient documentation

## 2015-06-22 DIAGNOSIS — O0932 Supervision of pregnancy with insufficient antenatal care, second trimester: Secondary | ICD-10-CM

## 2015-06-22 DIAGNOSIS — O0930 Supervision of pregnancy with insufficient antenatal care, unspecified trimester: Secondary | ICD-10-CM

## 2015-06-22 IMAGING — US US MFM OB COMP +14 WKS
1 series · 14 of 28 positions shown · non-contrast
Comparison: none

[Series 1: us mfm ob comp +14 wks · 93 acquisitions, 14 frames shown]
[im 4/93]
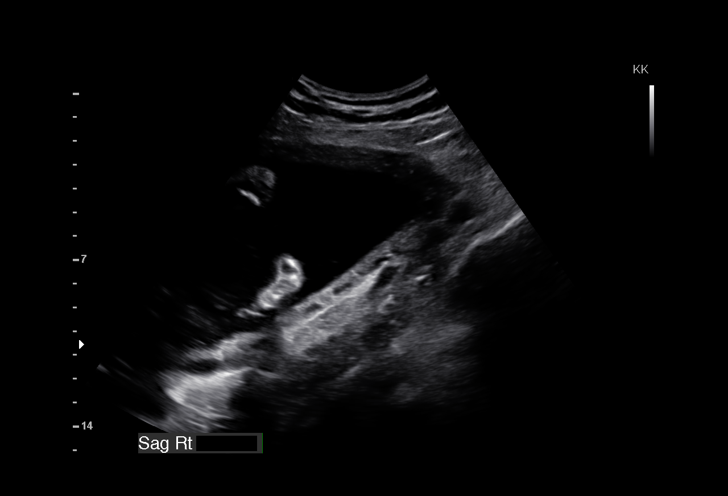
[im 11/93]
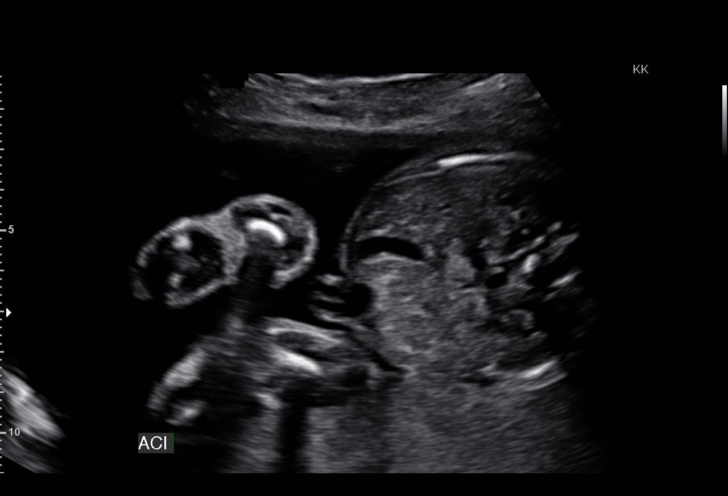
[im 18/93]
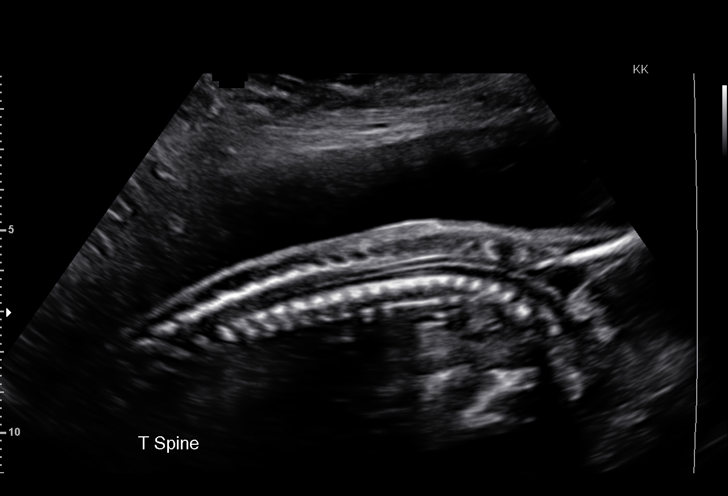
[im 24/93]
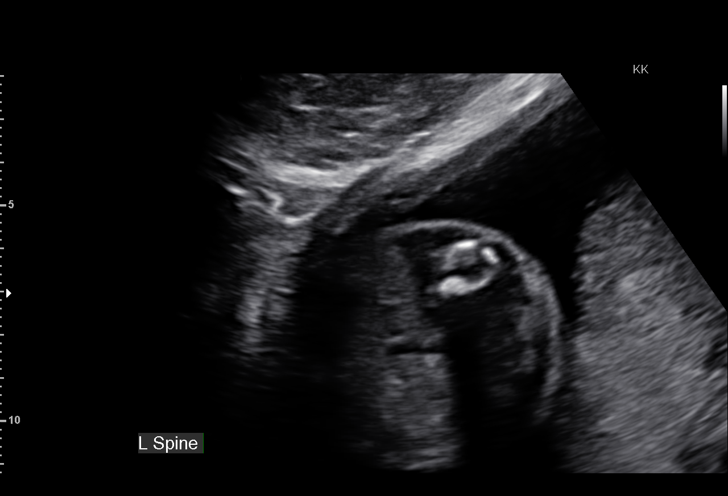
[im 31/93]
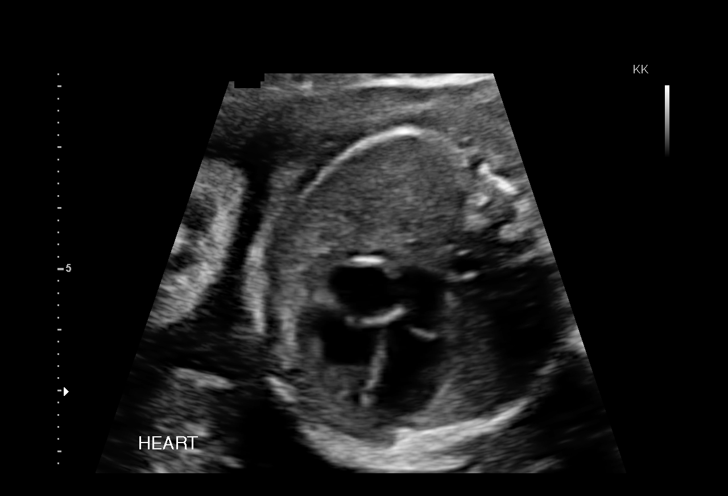
[im 38/93]
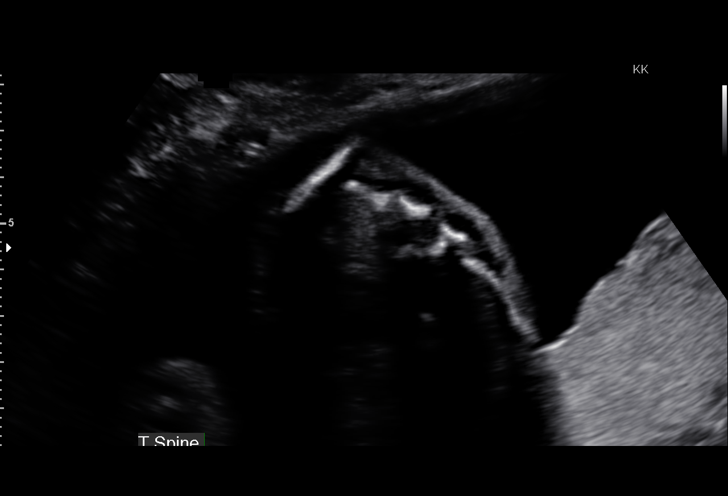
[im 45/93]
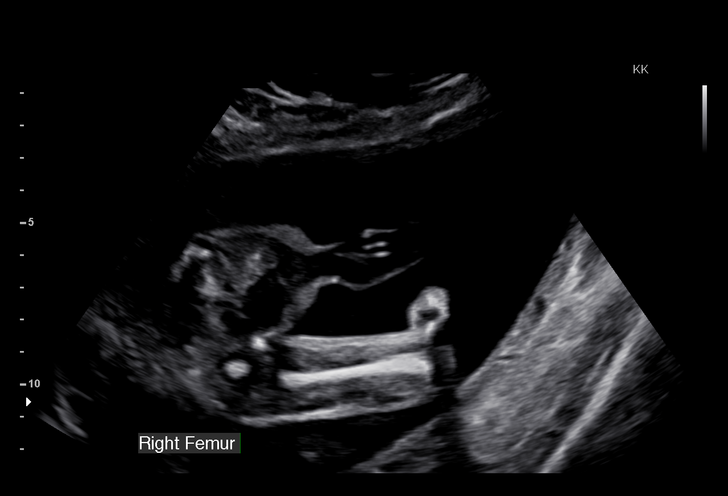
[im 52/93]
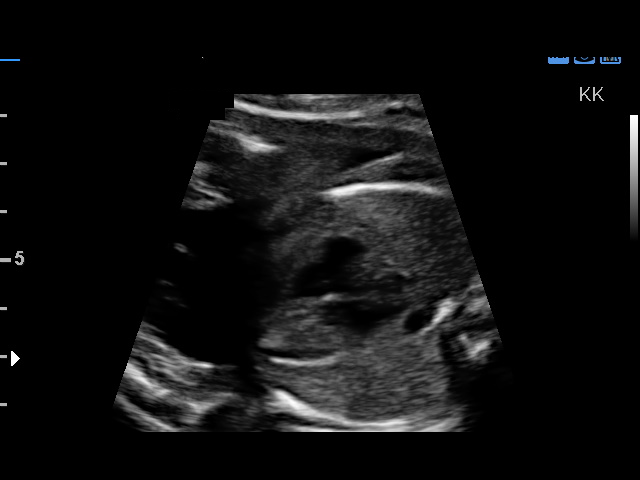
[im 58/93]
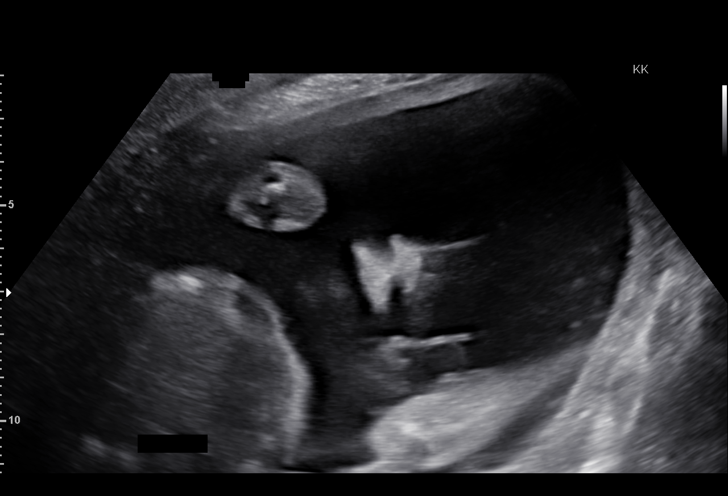
[im 65/93]
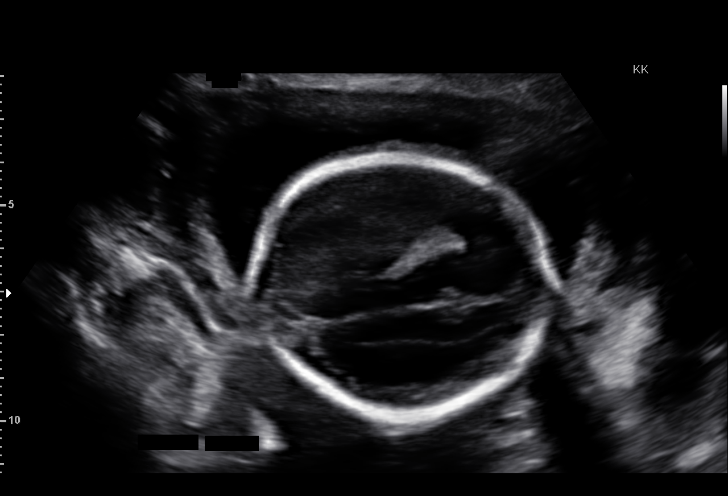
[im 72/93]
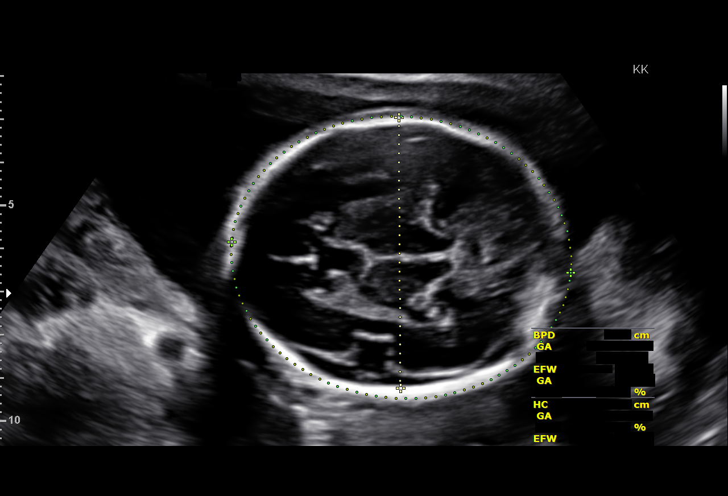
[im 79/93]
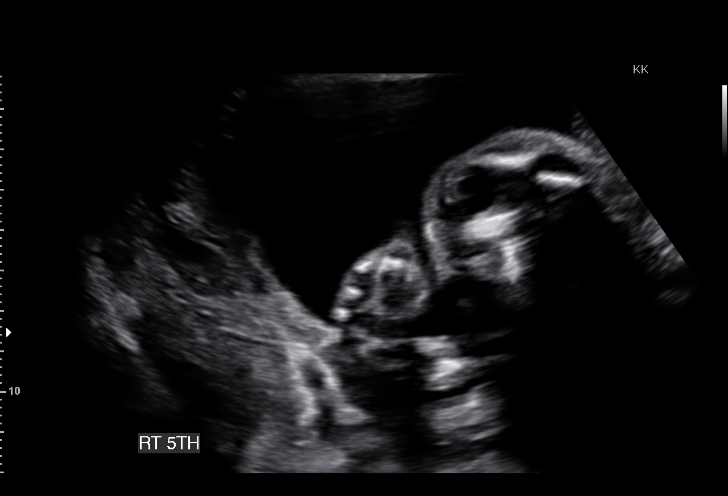
[im 86/93]
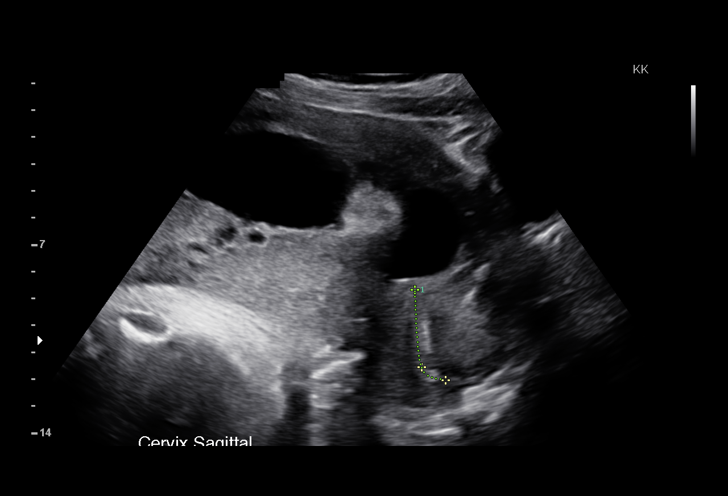
[im 93/93]
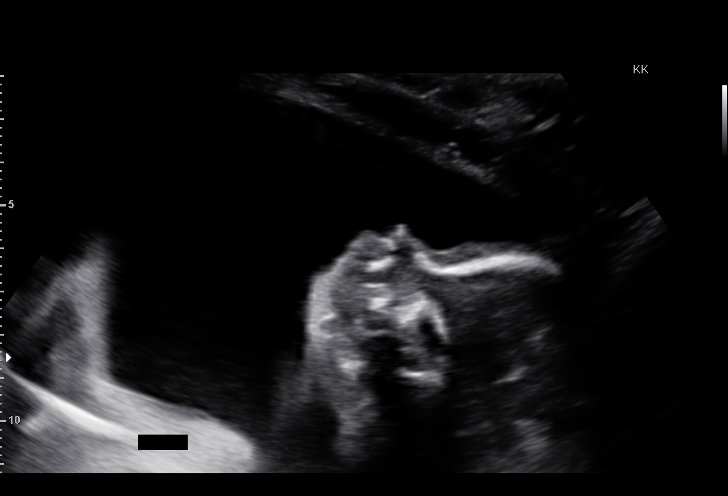

[14 of 28 positions shown; findings below may reference images not displayed]

pm)

Name:       SWALLOW                      Visit  [DATE] [DATE]
Date:

Practice
SWALLOW CNM

1  SWALLOW            [PHONE_NUMBER]       [PHONE_NUMBER]     [PHONE_NUMBER]
Indications

Basic anatomic survey                           Z36
25 weeks gestation of pregnancy
Previous cervical surgery (Cryosurgery)         [0C]
OB History

Height:        5'8"   Weight:   218        BMI:
Gravidity:     5         Term:  3        Prem:    0        SAB:   1
TOP:           0       Ectopic  0        Living:  3
:
Fetal Evaluation

Num Of Fetuses:      1
Fetal Heart          138
Rate(bpm):
Cardiac Activity:    Observed
Presentation:        Cephalic
Placenta:            Posterior, above cervical os
P. Cord Insertion:   Not well visualized

Amniotic Fluid
AFI FV:      Subjectively within normal limits
Larg Pckt:      6.6  cm
Biometry

BPD:      62.8  mm     G. Age:   25w 3d                  CI:        77.57   %    70 - 86
FL/HC:      20.5   %    18.7 -
HC:      225.7  mm     G. Age:   24w 4d        26   %    HC/AC:      1.07        1.04 -
AC:      211.3  mm     G. Age:   25w 5d        70   %    FL/BPD      73.7   %    71 - 87
:
FL:       46.3  mm     G. Age:   25w 3d        58   %    FL/AC:      21.9   %    20 - 24
HUM:      41.4  mm     G. Age:   25w 0d        50   %

Est.         811   gm   1 lb 13 oz      67   %
FW:
Gestational Age

LMP:           24w 5d        Date:  [DATE]                  EDD:   [DATE]
U/S Today:     25w 2d                                         EDD:   [DATE]
Best:          24w 5d    Det. By:   LMP  ([DATE])           EDD:   [DATE]
Anatomy

Cranium:          Appears normal         Aortic Arch:       Not well visualized
Fetal Cavum:      Appears normal         Ductal Arch:       Not well visualized
Ventricles:       Appears normal         Diaphragm:         Appears normal
Choroid Plexus:   Appears normal         Stomach:           Appears normal,
left sided
Cerebellum:       Appears normal         Abdomen:           Appears normal
Posterior         Appears normal         Abdominal          Appears nml (cord
Fossa:                                   Wall:              insert, abd wall)
Nuchal Fold:      Not applicable (>20    Cord Vessels:      Appears normal (3
wks GA)                                   vessel cord)
Face:             Appears normal         Kidneys:           Appear normal
(orbits and profile)
Lips:             Appears normal         Bladder:           Appears normal
Heart:            Appears normal         Spine:             Appears normal
(4CH, axis, and
situs
RVOT:             Not well visualized    Upper              Appears normal
Extremities:
LVOT:             Not well visualized    Lower              Appears normal
Extremities:

Other:   Female gender. Heels and 5th digit visualized. Technically difficult
due to fetal position.
Cervix Uterus Adnexa

Cervix
Length:               4  cm.
Normal appearance by transabdominal scan.
Impression

Singleton intrauterine pregnancy at 24 weeks 5 days
gestation with fetal cardiac activity
Cephalic presentation
Posterior placenta without evidence of previa
Normal appearing fetal growth and amniotic fluid volume
No apparent birth defects noted on fetal anatomic survey
today although not all structures well visualized on todays
exam
Normal appearing cervical length
Recommendations

Recommend follow-up ultrasound examination in 
3-4
weeks for completion of fetal anatomic survey

## 2015-07-17 ENCOUNTER — Encounter: Payer: Self-pay | Admitting: Advanced Practice Midwife

## 2015-12-11 ENCOUNTER — Encounter (HOSPITAL_COMMUNITY): Payer: Self-pay | Admitting: *Deleted

## 2017-10-15 ENCOUNTER — Encounter (HOSPITAL_COMMUNITY): Payer: Self-pay

## 2017-10-15 ENCOUNTER — Inpatient Hospital Stay (HOSPITAL_COMMUNITY)
Admission: AD | Admit: 2017-10-15 | Discharge: 2017-10-15 | Disposition: A | Discharge status: Home / Self Care | Payer: Self-pay | Source: Ambulatory Visit | Attending: Family Medicine | Admitting: Family Medicine

## 2017-10-15 DIAGNOSIS — O0930 Supervision of pregnancy with insufficient antenatal care, unspecified trimester: Secondary | ICD-10-CM

## 2017-10-15 DIAGNOSIS — O9A211 Injury, poisoning and certain other consequences of external causes complicating pregnancy, first trimester: Secondary | ICD-10-CM | POA: Insufficient documentation

## 2017-10-15 DIAGNOSIS — S0083XA Contusion of other part of head, initial encounter: Secondary | ICD-10-CM | POA: Insufficient documentation

## 2017-10-15 DIAGNOSIS — Z79899 Other long term (current) drug therapy: Secondary | ICD-10-CM | POA: Insufficient documentation

## 2017-10-15 DIAGNOSIS — W108XXA Fall (on) (from) other stairs and steps, initial encounter: Secondary | ICD-10-CM

## 2017-10-15 DIAGNOSIS — O26891 Other specified pregnancy related conditions, first trimester: Secondary | ICD-10-CM | POA: Insufficient documentation

## 2017-10-15 DIAGNOSIS — Z880 Allergy status to penicillin: Secondary | ICD-10-CM | POA: Insufficient documentation

## 2017-10-15 DIAGNOSIS — O26899 Other specified pregnancy related conditions, unspecified trimester: Secondary | ICD-10-CM

## 2017-10-15 DIAGNOSIS — W109XXA Fall (on) (from) unspecified stairs and steps, initial encounter: Secondary | ICD-10-CM | POA: Insufficient documentation

## 2017-10-15 DIAGNOSIS — Z9889 Other specified postprocedural states: Secondary | ICD-10-CM | POA: Insufficient documentation

## 2017-10-15 DIAGNOSIS — R109 Unspecified abdominal pain: Secondary | ICD-10-CM

## 2017-10-15 DIAGNOSIS — Z3A11 11 weeks gestation of pregnancy: Secondary | ICD-10-CM | POA: Insufficient documentation

## 2017-10-15 LAB — URINALYSIS, ROUTINE W REFLEX MICROSCOPIC
Bilirubin Urine: NEGATIVE
GLUCOSE, UA: NEGATIVE mg/dL
Hgb urine dipstick: NEGATIVE
Ketones, ur: NEGATIVE mg/dL
Nitrite: NEGATIVE
PH: 5 (ref 5.0–8.0)
Protein, ur: NEGATIVE mg/dL
Specific Gravity, Urine: 1.023 (ref 1.005–1.030)

## 2017-10-15 LAB — POCT PREGNANCY, URINE: Preg Test, Ur: POSITIVE — AB

## 2017-10-15 NOTE — MAU Provider Note (Addendum)
Chief Complaint: Abdominal Pain   First Provider Initiated Contact with Patient 10/15/17 2157        SUBJECTIVE HPI: Anna Luna is a 32 y.o. [id number] at [redacted]w[redacted]d by LMP who presents to maternity admissions reporting falling face forward down stairs while chasing child.  Is visiting from out of town   Orestes onto face and stomach.  Worried about hurting baby. No bleeding,  Has minimal cramping. She denies vaginal bleeding, vaginal itching/burning, urinary symptoms, h/a, dizziness, n/v, or fever/chills.    Abdominal Pain  This is a new problem. The current episode started today. The problem occurs intermittently. The problem has been unchanged. The pain is located in the suprapubic region. The pain is mild. The quality of the pain is cramping. The abdominal pain does not radiate. Pertinent negatives include no constipation, diarrhea, dysuria, fever, myalgias, nausea or vomiting. Nothing aggravates the pain. The pain is relieved by nothing. She has tried nothing for the symptoms.    RN Note: Larey Seat down the stairs 2 hours ago, fell directly on her stomach.  Just started cramping so wanted to come in and get checked out.  No VB.      Past Medical History:  Diagnosis Date  . Abnormal Pap smear   . Anemia   . Intrauterine device    Past Surgical History:  Procedure Laterality Date  . GYNECOLOGIC CRYOSURGERY     abnormal PAP   . KNEE SURGERY     Social History   Socioeconomic History  . Marital status: Single    Spouse name: Not on file  . Number of children: Not on file  . Years of education: Not on file  . Highest education level: Not on file  Occupational History  . Not on file  Social Needs  . Financial resource strain: Not on file  . Food insecurity:    Worry: Not on file    Inability: Not on file  . Transportation needs:    Medical: Not on file    Non-medical: Not on file  Tobacco Use  . Smoking status: Never Smoker  . Smokeless tobacco: Never Used  Substance and  Sexual Activity  . Alcohol use: No    Alcohol/week: 0.6 oz    Types: 1 Shots of liquor per week  . Drug use: No  . Sexual activity: Yes    Birth control/protection: None  Lifestyle  . Physical activity:    Days per week: Not on file    Minutes per session: Not on file  . Stress: Not on file  Relationships  . Social connections:    Talks on phone: Not on file    Gets together: Not on file    Attends religious service: Not on file    Active member of club or organization: Not on file    Attends meetings of clubs or organizations: Not on file    Relationship status: Not on file  . Intimate partner violence:    Fear of current or ex partner: Not on file    Emotionally abused: Not on file    Physically abused: Not on file    Forced sexual activity: Not on file  Other Topics Concern  . Not on file  Social History Narrative  . Not on file   No current facility-administered medications on file prior to encounter.    Current Outpatient Medications on File Prior to Encounter  Medication Sig Dispense Refill  . Prenatal Vit-Fe Fumarate-FA (PRENATAL MULTIVITAMIN) TABS tablet Take 1  tablet by mouth daily at 12 noon.    . promethazine (PHENERGAN) 12.5 MG tablet Take 1 tablet (12.5 mg total) by mouth every 6 (six) hours as needed for nausea or vomiting. 30 tablet 0   Allergies  Allergen Reactions  . Amoxicillin Anaphylaxis and Hives  . Penicillins Anaphylaxis and Hives    Has patient had a PCN reaction causing immediate rash, facial/tongue/throat swelling, SOB or lightheadedness with hypotension: Yes Has patient had a PCN reaction causing severe rash involving mucus membranes or skin necrosis: No Has patient had a PCN reaction that required hospitalization No Has patient had a PCN reaction occurring within the last 10 years: Yes If all of the above answers are "NO", then may proceed with Cephalosporin use.    I have reviewed patient's Past Medical Hx, Surgical Hx, Family Hx, Social  Hx, medications and allergies.   ROS:  Review of Systems  Constitutional: Negative for fever.  Gastrointestinal: Positive for abdominal pain. Negative for constipation, diarrhea, nausea and vomiting.  Genitourinary: Negative for dysuria.  Musculoskeletal: Negative for myalgias.   Review of Systems  Other systems negative   Physical Exam  Physical Exam Patient Vitals for the past 24 hrs:  BP Temp Temp src Pulse Resp Height Weight  10/15/17 2113 137/72 98.5 F (36.9 C) Oral 94 17 5\' 8"  (1.727 m) 229 lb 4 oz (104 kg)   Constitutional: Well-developed, well-nourished female in no acute distress.  Tenderness to right jaw  No obvious deformity Cardiovascular: normal rate Respiratory: normal effort GI: Abd soft, non-tender. Pos BS x 4 MS: Extremities nontender, no edema, normal ROM Neurologic: Alert and oriented x 4.  GU: Neg CVAT.  PELVIC EXAM: Cervix long and closed  FHR:  166  LAB RESULTS Results for orders placed or performed during the hospital encounter of 10/15/17 (from the past 24 hour(s))  Pregnancy, urine POC     Status: Abnormal   Collection Time: 10/15/17  9:37 PM  Result Value Ref Range   Preg Test, Ur POSITIVE (A) NEGATIVE       IMAGING No results found.  MAU Management/MDM: Discussed baby is well protected at this point of pregnancy Discussed potential for fall is increased in pregnancy, mostly later on.  Discussed fall prevention Come back in as needed for bleeding or strong cramps  ASSESSMENT Single IUP at 3067w6d S/P Fall Facial bruising  PLAN Discharge home Supportive care Fall prevention discussed  Pt stable at time of discharge. Encouraged to return here or to other Urgent Care/ED if she develops worsening of symptoms, increase in pain, fever, or other concerning symptoms.    Wynelle BourgeoisMarie Virgene Tirone CNM, MSN Certified Nurse-Midwife 10/15/2017  9:57 PM

## 2017-10-15 NOTE — Discharge Instructions (Signed)
Fall Prevention in the Home Falls can cause injuries and can affect people from all age groups. There are many simple things that you can do to make your home safe and to help prevent falls. What can I do on the outside of my home?  Regularly repair the edges of walkways and driveways and fix any cracks.  Remove high doorway thresholds.  Trim any shrubbery on the main path into your home.  Use bright outdoor lighting.  Clear walkways of debris and clutter, including tools and rocks.  Regularly check that handrails are securely fastened and in good repair. Both sides of any steps should have handrails.  Install guardrails along the edges of any raised decks or porches.  Have leaves, snow, and ice cleared regularly.  Use sand or salt on walkways during winter months.  In the garage, clean up any spills right away, including grease or oil spills. What can I do in the bathroom?  Use night lights.  Install grab bars by the toilet and in the tub and shower. Do not use towel bars as grab bars.  Use non-skid mats or decals on the floor of the tub or shower.  If you need to sit down while you are in the shower, use a plastic, non-slip stool.  Keep the floor dry. Immediately clean up any water that spills on the floor.  Remove soap buildup in the tub or shower on a regular basis.  Attach bath mats securely with double-sided non-slip rug tape.  Remove throw rugs and other tripping hazards from the floor. What can I do in the bedroom?  Use night lights.  Make sure that a bedside light is easy to reach.  Do not use oversized bedding that drapes onto the floor.  Have a firm chair that has side arms to use for getting dressed.  Remove throw rugs and other tripping hazards from the floor. What can I do in the kitchen?  Clean up any spills right away.  Avoid walking on wet floors.  Place frequently used items in easy-to-reach places.  If you need to reach for something above  you, use a sturdy step stool that has a grab bar.  Keep electrical cables out of the way.  Do not use floor polish or wax that makes floors slippery. If you have to use wax, make sure that it is non-skid floor wax.  Remove throw rugs and other tripping hazards from the floor. What can I do in the stairways?  Do not leave any items on the stairs.  Make sure that there are handrails on both sides of the stairs. Fix handrails that are broken or loose. Make sure that handrails are as long as the stairways.  Check any carpeting to make sure that it is firmly attached to the stairs. Fix any carpet that is loose or worn.  Avoid having throw rugs at the top or bottom of stairways, or secure the rugs with carpet tape to prevent them from moving.  Make sure that you have a light switch at the top of the stairs and the bottom of the stairs. If you do not have them, have them installed. What are some other fall prevention tips?  Wear closed-toe shoes that fit well and support your feet. Wear shoes that have rubber soles or low heels.  When you use a stepladder, make sure that it is completely opened and that the sides are firmly locked. Have someone hold the ladder while you are using  it. Do not climb a closed stepladder.  Add color or contrast paint or tape to grab bars and handrails in your home. Place contrasting color strips on the first and last steps.  Use mobility aids as needed, such as canes, walkers, scooters, and crutches.  Turn on lights if it is dark. Replace any light bulbs that burn out.  Set up furniture so that there are clear paths. Keep the furniture in the same spot.  Fix any uneven floor surfaces.  Choose a carpet design that does not hide the edge of steps of a stairway.  Be aware of any and all pets.  Review your medicines with your healthcare provider. Some medicines can cause dizziness or changes in blood pressure, which increase your risk of falling. Talk with  your health care provider about other ways that you can decrease your risk of falls. This may include working with a physical therapist or trainer to improve your strength, balance, and endurance. This information is not intended to replace advice given to you by your health care provider. Make sure you discuss any questions you have with your health care provider. Document Released: 06/27/2002 Document Revised: 12/04/2015 Document Reviewed: 08/11/2014 Elsevier Interactive Patient Education  2018 [employer] First Trimester of Pregnancy The first trimester of pregnancy is from week 1 until the end of week 13 (months 1 through 3). During this time, your baby will begin to develop inside you. At 6-8 weeks, the eyes and face are formed, and the heartbeat can be seen on ultrasound. At the end of 12 weeks, all the baby's organs are formed. Prenatal care is all the medical care you receive before the birth of your baby. Make sure you get good prenatal care and follow all of your doctor's instructions. Follow these instructions at home: Medicines  Take over-the-counter and prescription medicines only as told by your doctor. Some medicines are safe and some medicines are not safe during pregnancy.  Take a prenatal vitamin that contains at least 600 micrograms (mcg) of folic acid.  If you have trouble pooping (constipation), take medicine that will make your stool soft (stool softener) if your doctor approves. Eating and drinking  Eat regular, healthy meals.  Your doctor will tell you the amount of weight gain that is right for you.  Avoid raw meat and uncooked cheese.  If you feel sick to your stomach (nauseous) or throw up (vomit): ? Eat 4 or 5 small meals a day instead of 3 large meals. ? Try eating a few soda crackers. ? Drink liquids between meals instead of during meals.  To prevent constipation: ? Eat foods that are high in fiber, like fresh fruits and vegetables, whole grains, and  beans. ? Drink enough fluids to keep your pee (urine) clear or pale yellow. Activity  Exercise only as told by your doctor. Stop exercising if you have cramps or pain in your lower belly (abdomen) or low back.  Do not exercise if it is too hot, too humid, or if you are in a place of great height (high altitude).  Try to avoid standing for long periods of time. Move your legs often if you must stand in one place for a long time.  Avoid heavy lifting.  Wear low-heeled shoes. Sit and stand up straight.  You can have sex unless your doctor tells you not to. Relieving pain and discomfort  Wear a good support bra if your breasts are sore.  Take warm water baths (sitz baths)  to soothe pain or discomfort caused by hemorrhoids. Use hemorrhoid cream if your doctor says it is okay.  Rest with your legs raised if you have leg cramps or low back pain.  If you have puffy, bulging veins (varicose veins) in your legs: ? Wear support hose or compression stockings as told by your doctor. ? Raise (elevate) your feet for 15 minutes, 3-4 times a day. ? Limit salt in your food. Prenatal care  Schedule your prenatal visits by the twelfth week of pregnancy.  Write down your questions. Take them to your prenatal visits.  Keep all your prenatal visits as told by your doctor. This is important. Safety  Wear your seat belt at all times when driving.  Make a list of emergency phone numbers. The list should include numbers for family, friends, the hospital, and police and fire departments. General instructions  Ask your doctor for a referral to a local prenatal class. Begin classes no later than at the start of month 6 of your pregnancy.  Ask for help if you need counseling or if you need help with nutrition. Your doctor can give you advice or tell you where to go for help.  Do not use hot tubs, steam rooms, or saunas.  Do not douche or use tampons or scented sanitary pads.  Do not cross your legs  for long periods of time.  Avoid all herbs and alcohol. Avoid drugs that are not approved by your doctor.  Do not use any tobacco products, including cigarettes, chewing tobacco, and electronic cigarettes. If you need help quitting, ask your doctor. You may get counseling or other support to help you quit.  Avoid cat litter boxes and soil used by cats. These carry germs that can cause birth defects in the baby and can cause a loss of your baby (miscarriage) or stillbirth.  Visit your dentist. At home, brush your teeth with a soft toothbrush. Be gentle when you floss. Contact a doctor if:  You are dizzy.  You have mild cramps or pressure in your lower belly.  You have a nagging pain in your belly area.  You continue to feel sick to your stomach, you throw up, or you have watery poop (diarrhea).  You have a bad smelling fluid coming from your vagina.  You have pain when you pee (urinate).  You have increased puffiness (swelling) in your face, hands, legs, or ankles. Get help right away if:  You have a fever.  You are leaking fluid from your vagina.  You have spotting or bleeding from your vagina.  You have very bad belly cramping or pain.  You gain or lose weight rapidly.  You throw up blood. It may look like coffee grounds.  You are around people who have [place] measles, fifth disease, or chickenpox.  You have a very bad headache.  You have shortness of breath.  You have any kind of trauma, such as from a fall or a car accident. Summary  The first trimester of pregnancy is from week 1 until the end of week 13 (months 1 through 3).  To take care of yourself and your unborn baby, you will need to eat healthy meals, take medicines only if your doctor tells you to do so, and do activities that are safe for you and your baby.  Keep all follow-up visits as told by your doctor. This is important as your doctor will have to ensure that your baby is healthy and growing  well. This  information is not intended to replace advice given to you by your health care provider. Make sure you discuss any questions you have with your health care provider. Document Released: 12/24/2007 Document Revised: 07/15/2016 Document Reviewed: 07/15/2016 Elsevier Interactive Patient Education  2017 [employer].

## 2017-10-15 NOTE — MAU Note (Signed)
Fell down the stairs 2 hours ago, fell directly on her stomach.  Just started cramping so wanted to come in and get checked out.  No VB.

## 2018-07-24 ENCOUNTER — Encounter (HOSPITAL_COMMUNITY): Payer: Self-pay

## 2021-08-20 ENCOUNTER — Inpatient Hospital Stay
Admit: 2021-08-20 | Discharge: 2021-08-20 | Disposition: A | Discharge status: Home / Self Care | Payer: Medicaid - Out of State | Attending: Emergency Medicine

## 2021-08-20 ENCOUNTER — Emergency Department
Admit: 2021-08-20 | Payer: Medicaid - Out of State | Primary: Student in an Organized Health Care Education/Training Program

## 2021-08-20 LAB — CBC WITH AUTO DIFFERENTIAL
Basophils %: 1 % (ref 0–1)
Basophils Absolute: 0 10*3/uL (ref 0.0–0.1)
Eosinophils %: 1 % (ref 0–7)
Eosinophils Absolute: 0.1 10*3/uL (ref 0.0–0.4)
Granulocyte Absolute Count: 0 10*3/uL (ref 0.00–0.04)
Hematocrit: 35.5 % (ref 35.0–47.0)
Hemoglobin: 11.4 g/dL — ABNORMAL LOW (ref 11.5–16.0)
Immature Granulocytes: 0 % (ref 0–0.5)
Lymphocytes %: 34 % (ref 12–49)
Lymphocytes Absolute: 2.4 10*3/uL (ref 0.8–3.5)
MCH: 28.3 PG (ref 26.0–34.0)
MCHC: 32.1 g/dL (ref 30.0–36.5)
MCV: 88.1 FL (ref 80.0–99.0)
MPV: 8.9 FL (ref 8.9–12.9)
Monocytes %: 5 % (ref 5–13)
Monocytes Absolute: 0.3 10*3/uL (ref 0.0–1.0)
NRBC Absolute: 0 10*3/uL (ref 0.00–0.01)
Neutrophils %: 59 % (ref 32–75)
Neutrophils Absolute: 4.2 10*3/uL (ref 1.8–8.0)
Nucleated RBCs: 0 PER 100 WBC
Platelets: 330 10*3/uL (ref 150–400)
RBC: 4.03 M/uL (ref 3.80–5.20)
RDW: 13.3 % (ref 11.5–14.5)
WBC: 7 10*3/uL (ref 3.6–11.0)

## 2021-08-20 LAB — COMPREHENSIVE METABOLIC PANEL
ALT: 16 U/L (ref 12–78)
AST: 6 U/L — ABNORMAL LOW (ref 15–37)
Albumin/Globulin Ratio: 0.9 — ABNORMAL LOW (ref 1.1–2.2)
Albumin: 3.7 g/dL (ref 3.5–5.0)
Alkaline Phosphatase: 109 U/L (ref 45–117)
Anion Gap: 9 mmol/L (ref 5–15)
BUN: 8 mg/dL (ref 6–20)
Bun/Cre Ratio: 9 — ABNORMAL LOW (ref 12–20)
CO2: 26 mmol/L (ref 21–32)
Calcium: 8.8 mg/dL (ref 8.5–10.1)
Chloride: 106 mmol/L (ref 97–108)
Creatinine: 0.85 mg/dL (ref 0.55–1.02)
ESTIMATED GLOMERULAR FILTRATION RATE: >60 mL/min/{1.73_m2} (ref >60)
Globulin: 4.2 g/dL — ABNORMAL HIGH (ref 2.0–4.0)
Glucose: 111 mg/dL — ABNORMAL HIGH (ref 65–100)
Potassium: 3.5 mmol/L (ref 3.5–5.1)
Sodium: 141 mmol/L (ref 136–145)
Total Bilirubin: 0.2 mg/dL (ref 0.2–1.0)
Total Protein: 7.9 g/dL (ref 6.4–8.2)

## 2021-08-20 LAB — URINE MICROSCOPIC
BACTERIA, URINE: NEGATIVE /hpf
Bacteria: NEGATIVE /hpf

## 2021-08-20 LAB — URINALYSIS W/ RFLX MICROSCOPIC
Bilirubin, Urine: NEGATIVE
Bilirubin: NEGATIVE
Glucose, Ur: NEGATIVE mg/dL
Glucose: NEGATIVE mg/dL
Ketone: NEGATIVE mg/dL
Ketones, Urine: NEGATIVE mg/dL
Nitrite, Urine: NEGATIVE
Nitrites: NEGATIVE
Protein, UA: NEGATIVE mg/dL
Protein: NEGATIVE mg/dL
Specific Gravity, UA: 1.01 (ref 1.003–1.030)
Specific gravity: 1.01 (ref 1.003–1.030)
Urobilinogen, UA, POCT: 0.2 EU/dL (ref 0.2–1.0)
Urobilinogen: 0.2 EU/dL (ref 0.2–1.0)
pH (UA): 5 (ref 5.0–8.0)
pH, UA: 5 (ref 5.0–8.0)

## 2021-08-20 LAB — D-DIMER, QUANTITATIVE: D-Dimer, Quant: <0.27 ug/ml(FEU) (ref <0.50)

## 2021-08-20 LAB — HCG URINE, QL
HCG urine, QL: NEGATIVE
Pregnancy Test(Urn): NEGATIVE

## 2021-08-20 LAB — METABOLIC PANEL, COMPREHENSIVE
A-G Ratio: 0.9 — ABNORMAL LOW (ref 1.1–2.2)
ALT (SGPT): 16 U/L (ref 12–78)
AST (SGOT): 6 U/L — ABNORMAL LOW (ref 15–37)
Albumin: 3.7 g/dL (ref 3.5–5.0)
Alk. phosphatase: 109 U/L (ref 45–117)
Anion gap: 9 mmol/L (ref 5–15)
BUN/Creatinine ratio: 9 — ABNORMAL LOW (ref 12–20)
BUN: 8 mg/dL (ref 6–20)
Bilirubin, total: 0.2 mg/dL (ref 0.2–1.0)
CO2: 26 mmol/L (ref 21–32)
Calcium: 8.8 mg/dL (ref 8.5–10.1)
Chloride: 106 mmol/L (ref 97–108)
Creatinine: 0.85 mg/dL (ref 0.55–1.02)
Globulin: 4.2 g/dL — ABNORMAL HIGH (ref 2.0–4.0)
Glucose: 111 mg/dL — ABNORMAL HIGH (ref 65–100)
Potassium: 3.5 mmol/L (ref 3.5–5.1)
Protein, total: 7.9 g/dL (ref 6.4–8.2)
Sodium: 141 mmol/L (ref 136–145)
eGFR: >60 mL/min/{1.73_m2} (ref >60)

## 2021-08-20 LAB — CBC WITH AUTOMATED DIFF
ABS. BASOPHILS: 0 10*3/uL (ref 0.0–0.1)
ABS. EOSINOPHILS: 0.1 10*3/uL (ref 0.0–0.4)
ABS. IMM. GRANS.: 0 10*3/uL (ref 0.00–0.04)
ABS. LYMPHOCYTES: 2.4 10*3/uL (ref 0.8–3.5)
ABS. MONOCYTES: 0.3 10*3/uL (ref 0.0–1.0)
ABS. NEUTROPHILS: 4.2 10*3/uL (ref 1.8–8.0)
ABSOLUTE NRBC: 0 10*3/uL (ref 0.00–0.01)
BASOPHILS: 1 % (ref 0–1)
EOSINOPHILS: 1 % (ref 0–7)
HCT: 35.5 % (ref 35.0–47.0)
HGB: 11.4 g/dL — ABNORMAL LOW (ref 11.5–16.0)
IMMATURE GRANULOCYTES: 0 % (ref 0–0.5)
LYMPHOCYTES: 34 % (ref 12–49)
MCH: 28.3 PG (ref 26.0–34.0)
MCHC: 32.1 g/dL (ref 30.0–36.5)
MCV: 88.1 FL (ref 80.0–99.0)
MONOCYTES: 5 % (ref 5–13)
MPV: 8.9 FL (ref 8.9–12.9)
NEUTROPHILS: 59 % (ref 32–75)
NRBC: 0 PER 100 WBC
PLATELET: 330 10*3/uL (ref 150–400)
RBC: 4.03 M/uL (ref 3.80–5.20)
RDW: 13.3 % (ref 11.5–14.5)
WBC: 7 10*3/uL (ref 3.6–11.0)

## 2021-08-20 LAB — D DIMER: D DIMER: <0.27 ug/ml(FEU) (ref <0.50)

## 2021-08-20 MED ORDER — MORPHINE 4 MG/ML INTRAVENOUS SOLUTION
4 mg/mL | Freq: Once | INTRAVENOUS | Status: DC
Start: 2021-08-20 — End: 2021-08-20

## 2021-08-20 MED ORDER — MORPHINE 4 MG/ML INTRAVENOUS SOLUTION
4 mg/mL | INTRAVENOUS | Status: AC
Start: 2021-08-20 — End: 2021-08-20
  Administered 2021-08-20: 12:00:00 via INTRAVENOUS

## 2021-08-20 MED ORDER — ALUM-MAG HYDROXIDE-SIMETH 200 MG-200 MG-20 MG/5 ML ORAL SUSP
200-200-20 mg/5 mL | ORAL | Status: DC | PRN
Start: 2021-08-20 — End: 2021-08-20

## 2021-08-20 MED ORDER — ONDANSETRON (PF) 4 MG/2 ML INJECTION
4 mg/2 mL | INTRAMUSCULAR | Status: AC
Start: 2021-08-20 — End: 2021-08-20
  Administered 2021-08-20: 12:00:00 via INTRAVENOUS

## 2021-08-20 MED ORDER — ALUM-MAG HYDROXIDE-SIMETH 200 MG-200 MG-20 MG/5 ML ORAL SUSP
200-200-20 mg/5 mL | ORAL | Status: AC
Start: 2021-08-20 — End: 2021-08-20
  Administered 2021-08-20: 12:00:00 via ORAL

## 2021-08-20 MED ORDER — CYCLOBENZAPRINE 10 MG TAB
10 mg | ORAL_TABLET | Freq: Three times a day (TID) | ORAL | 0 refills | Status: AC | PRN
Start: 2021-08-20 — End: 2021-08-25

## 2021-08-20 MED ORDER — ONDANSETRON (PF) 4 MG/2 ML INJECTION
4 mg/2 mL | Freq: Once | INTRAMUSCULAR | Status: DC
Start: 2021-08-20 — End: 2021-08-20

## 2021-08-20 MED FILL — MORPHINE 4 MG/ML SYRINGE: 4 mg/mL | INTRAMUSCULAR | Qty: 1

## 2021-08-20 MED FILL — ONDANSETRON (PF) 4 MG/2 ML INJECTION: 4 mg/2 mL | INTRAMUSCULAR | Qty: 2

## 2021-08-20 MED FILL — ALUM-MAG HYDROXIDE-SIMETH 200 MG-200 MG-20 MG/5 ML ORAL SUSP: 200-200-20 mg/5 mL | ORAL | Qty: 30

## 2021-08-20 NOTE — ED Notes (Signed)
Report given to Stacy, RN.

## 2021-08-20 NOTE — ED Notes (Signed)
Patient arrives to the ED with complaint of chest pain x2 days, states same got worse this morning. Patient describes the pain is midsternal and an epigastric, states same is stabbing in nature.

## 2021-08-20 NOTE — ED Provider Notes (Signed)
ED Provider Notes by Norva KarvonenAh, Flint Hakeem W, MD at 08/20/21 [phone]                Author: Akina Maish, Belia Hemanobert W, MD  Service: EMERGENCY  Author Type: Physician       Filed: 08/20/21 0726  Date of Service: 08/20/21 0525  Status: Addendum          Editor: Branston Halsted, Belia Hemanobert W, MD (Physician)          Related Notes: Original Note by Norva KarvonenAh, Danyel Tobey W, MD (Physician) filed at 08/20/21 [phone]               EMERGENCY DEPARTMENT HISTORY AND PHYSICAL EXAM   ?      Date: 08/20/2021   Patient Name: Cassandra Olsen      History of Presenting Illness      Patient presents with:   Chest Pain         History Provided By: Patient      HPI: Cassandra BooksMichelle Olsen, 36 y.o. female with no significant past medical history presents to the ED with cc of left-sided chest pain and substernal chest pain that started 2 days ago.  Discomfort was dull but became sharp last night.  Pain is rated 8 out  of 10.  Now she feels it more in epigastrium and substernal area.  Pain is worse with breathing.   She denies shortness of breath, cough.      She does not smoke or drink alcohol.         There are no other complaints, changes, or physical findings at this time.      PCP: No primary care provider on file.            Past History      Past Medical History:   Past Medical History:   No date: History of tubal ligation      Past Surgical History:   History reviewed. No pertinent surgical history.      Family History:   History reviewed.  No pertinent family history.         Social History:   Social History     Tobacco Use       Smoking status: Never       Smokeless tobacco: Never     Alcohol use: Not Currently     Drug use: Not Currently         Allergies:    -- Amoxicillin -- Unknown (comments)    -- Pcn [Penicillins] -- Unknown (comments)         Review of Systems   @ROSBYAGE @      Physical Exam   @PHYEXAMBYAGE @      Diagnostic Study Results      Labs -    Recent Results (from the past 12 hour(s))   -EKG, 12 LEAD, INITIAL:    Collection Time: 08/20/21  5:14 AM        Result                       Value             Ref Range            Ventricular Rate            87                BPM                  Atrial  Rate                 87                BPM                  P-R Interval                158               ms                   QRS Duration                107               ms                   Q-T Interval                371               ms                   QTC Calculation (Bezet)     447               ms                   Calculated P Axis           20                degrees              Calculated R Axis           4                 degrees              Calculated T Axis           9                 degrees              Diagnosis                   Sinus rhythm                         Radiologic Studies -    XR CHEST PORT    (Results Pending)   CT Results  (Last 48 hours)     None        CXR Results  (Last 48 hours)     None           Medical Decision Making and ED Course   I am the first provider for this patient.      I reviewed the vital signs, available nursing notes, past medical history, past surgical history, family history and social history.      Vital Signs-Reviewed the patient's vital signs.   Empty flowsheet group.         EKG interpretation: (Preliminary)   Rhythm: normal sinus rhythm; and regular . Rate (approx.): 87; Axis: normal; PR interval: normal; QRS interval: normal ; ST/T wave: normal.      Records Reviewed: None      Provider Notes (Medical Decision Making):  ED Course:    Initial assessment performed. The patients presenting problems have been discussed, and they are in agreement with the care plan formulated and outlined with them.  I have encouraged them to ask questions as they arise throughout their visit.                         Disposition               DISCHARGE PLAN:   1. There are no discharge medications for this patient.      2. Follow-up Information     None       3.  Return to ED if worse       Diagnosis      Clinical Impression: No  diagnosis found.      Attestations:      Norva Karvonenobert W Trayce Caravello, MD      Please note that this dictation was completed with Dragon, the computer voice recognition software.  Quite often unanticipated grammatical, syntax, homophones, and other interpretive errors are inadvertently transcribed by the computer software.  Please  disregard these errors.  Please excuse any errors that have escaped final proofreading.  Thank you.      ?                Past Medical History:        Diagnosis  Date         ?  History of tubal ligation             History reviewed. No pertinent surgical history.        History reviewed. No pertinent family history.        Social History          Socioeconomic History         ?  Marital status:  Not on file              Spouse name:  Not on file         ?  Number of children:  Not on file     ?  Years of education:  Not on file     ?  Highest education level:  Not on file       Occupational History        ?  Not on file       Tobacco Use         ?  Smoking status:  Never     ?  Smokeless tobacco:  Never       Substance and Sexual Activity         ?  Alcohol use:  Not Currently     ?  Drug use:  Not Currently     ?  Sexual activity:  Yes             Comment: tubal ligation        Other Topics  Concern        ?  Not on file       Social History Narrative        ?  Not on file          Social Determinants of Health          Financial Resource Strain: Not on file     Food Insecurity: Not on file     Transportation Needs: Not on file     Physical Activity: Not on file  Stress: Not on file     Social Connections: Not on file     Intimate Partner Violence: Not on file       Housing Stability: Not on file              ALLERGIES: Amoxicillin and Pcn [penicillins]      Review of Systems    Constitutional: Negative.     HENT: Negative.      Respiratory:  Negative for shortness of breath.     Cardiovascular:  Positive for chest pain. Negative for palpitations.    Gastrointestinal: Negative.     Musculoskeletal:  Negative.          Vitals:           08/20/21 0512  08/20/21 0519         BP:  126/75       Pulse:  87       Resp:  18       Temp:  97.8 ??F (36.6 ??C)       SpO2:  99%  99%     Weight:  119.8 kg (264 lb 1.6 oz)           Height:  5\' 9"  (1.753 m)                  Physical Exam   Vitals and nursing note reviewed.    Constitutional:        Appearance: She is obese.     Eyes:       Extraocular Movements: Extraocular movements intact.       Pupils: Pupils are equal, round, and reactive to light.     Cardiovascular:       Rate and Rhythm: Normal rate and regular rhythm.       Heart sounds: Normal heart sounds.    Pulmonary:       Effort: Pulmonary effort is normal.       Breath sounds: Normal breath sounds.    Chest:       Chest wall: Tenderness present.            Abdominal:       Tenderness: There is abdominal tenderness.            Skin:      General: Skin is warm.       Capillary Refill: Capillary refill takes less than 2 seconds.    Neurological:       General: No focal deficit present.       Mental Status: She is alert.           Medical Decision Making   Patient presents with chest pain and epigastric pain.   ECG is normal.   Consider chest wall pain, GERD, PE.   Doubt gallbladder problem since Murphy sign is negative.      Ddimer is low negative.     CXR is negative.         Amount and/or Complexity of Data Reviewed   Labs: ordered.   Radiology: ordered.   ECG/medicine tests: ordered.      Risk   OTC drugs.   Prescription drug management.                Procedures

## 2021-08-21 LAB — EKG 12-LEAD
Atrial Rate: 87 {beats}/min
P Axis: 20 degrees
P-R Interval: 158 ms
Q-T Interval: 371 ms
QRS Duration: 107 ms
QTc Calculation (Bazett): 447 ms
R Axis: 4 degrees
T Axis: 9 degrees
Ventricular Rate: 87 {beats}/min

## 2021-08-21 LAB — EKG, 12 LEAD, INITIAL
Atrial Rate: 87 {beats}/min
Calculated P Axis: 20 degrees
Calculated R Axis: 4 degrees
Calculated T Axis: 9 degrees
P-R Interval: 158 ms
Q-T Interval: 371 ms
QRS Duration: 107 ms
QTC Calculation (Bezet): 447 ms
Ventricular Rate: 87 {beats}/min
